# Patient Record
Sex: Female | Born: 1995 | Race: Black or African American | Hispanic: No | Marital: Single | Smoking: Current every day smoker
Health system: Southern US, Community
[De-identification: ages and names within clinical notes are randomized; demographics above are authoritative.]

## PROBLEM LIST (undated history)

## (undated) DIAGNOSIS — K5900 Constipation, unspecified: Secondary | ICD-10-CM

## (undated) DIAGNOSIS — J45909 Unspecified asthma, uncomplicated: Secondary | ICD-10-CM

## (undated) HISTORY — DX: Constipation, unspecified: K59.00

---
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
## 2017-06-08 ENCOUNTER — Ambulatory Visit (HOSPITAL_COMMUNITY)
Admission: EM | Admit: 2017-06-08 | Discharge: 2017-06-08 | Disposition: A | Discharge status: Home / Self Care | Payer: 59 | Attending: Family Medicine | Admitting: Family Medicine

## 2017-06-08 ENCOUNTER — Other Ambulatory Visit: Payer: Self-pay

## 2017-06-08 ENCOUNTER — Encounter (HOSPITAL_COMMUNITY): Payer: Self-pay | Admitting: Emergency Medicine

## 2017-06-08 DIAGNOSIS — S39012A Strain of muscle, fascia and tendon of lower back, initial encounter: Secondary | ICD-10-CM

## 2017-06-08 DIAGNOSIS — S161XXA Strain of muscle, fascia and tendon at neck level, initial encounter: Secondary | ICD-10-CM | POA: Diagnosis not present

## 2017-06-08 MED ORDER — IBUPROFEN 600 MG PO TABS: 600.0000 mg | ORAL_TABLET | Freq: Four times a day (QID) | ORAL | 0 refills | Status: AC | PRN
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
## 2018-09-27 ENCOUNTER — Emergency Department (HOSPITAL_COMMUNITY): Payer: 59

## 2018-09-27 ENCOUNTER — Emergency Department (HOSPITAL_COMMUNITY)
Admission: EM | Admit: 2018-09-27 | Discharge: 2018-09-28 | Disposition: A | Discharge status: Home / Self Care | Payer: 59 | Attending: Emergency Medicine | Admitting: Emergency Medicine

## 2018-09-27 ENCOUNTER — Other Ambulatory Visit: Payer: Self-pay

## 2018-09-27 DIAGNOSIS — M25512 Pain in left shoulder: Secondary | ICD-10-CM | POA: Diagnosis not present

## 2018-09-27 DIAGNOSIS — F17290 Nicotine dependence, other tobacco product, uncomplicated: Secondary | ICD-10-CM | POA: Diagnosis not present

## 2018-09-27 DIAGNOSIS — R51 Headache: Secondary | ICD-10-CM | POA: Insufficient documentation

## 2018-09-27 MED ORDER — METHOCARBAMOL 500 MG PO TABS
500.0000 mg | ORAL_TABLET | Freq: Two times a day (BID) | ORAL | 0 refills | Status: AC
Start: 2018-09-27 — End: 2018-10-04

## 2018-09-27 MED ORDER — DIAZEPAM 5 MG PO TABS
5.0000 mg | ORAL_TABLET | Freq: Once | ORAL | Status: AC
  Administered 2018-09-27: 5 mg via ORAL
  Filled 2018-09-27: qty 1

## 2018-09-27 MED ORDER — NAPROXEN 250 MG PO TABS
500.0000 mg | ORAL_TABLET | Freq: Once | ORAL | Status: AC
  Administered 2018-09-27: 500 mg via ORAL
  Filled 2018-09-27: qty 2

## 2018-09-27 MED ORDER — NAPROXEN 500 MG PO TABS: 500.0000 mg | ORAL_TABLET | Freq: Two times a day (BID) | ORAL | 0 refills | Status: AC
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
## 2018-09-27 NOTE — Discharge Instructions (Addendum)
I have prescribed muscle relaxers for your pain, please do not drink or drive while taking this medications as they can make you drowsy. You may apply ice or heat to the area, please follow up with PCP as needed.

## 2018-09-27 NOTE — ED Notes (Signed)
Patient transported to CT 
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
## 2020-05-06 ENCOUNTER — Ambulatory Visit: Payer: 59 | Attending: Internal Medicine

## 2020-05-06 ENCOUNTER — Other Ambulatory Visit: Payer: Self-pay

## 2020-05-06 DIAGNOSIS — Z23 Encounter for immunization: Secondary | ICD-10-CM

## 2020-05-06 NOTE — Progress Notes (Signed)
   Covid-19 Vaccination Clinic  Name:  Nicole Gonzales    MRN: [id number] DOB: 1995-07-25  05/06/2020  [name] was observed post Covid-19 immunization for 15 minutes without incident. She was provided with Vaccine Information Sheet and instruction to access the V-Safe system.   [name] was instructed to call 911 with any severe reactions post vaccine: Marland Kitchen Difficulty breathing  . Swelling of face and throat  . A fast heartbeat  . A bad rash all over body  . Dizziness and weakness   Immunizations Administered    Name Date Dose VIS Date Route   PFIZER Comrnaty(Gray TOP) Covid-19 Vaccine 05/06/2020  3:22 PM 0.3 mL 03/06/2020 Intramuscular   Manufacturer: [employer]   Lot: [id number]   NDC: [phone]
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
## 2021-07-12 ENCOUNTER — Emergency Department (HOSPITAL_BASED_OUTPATIENT_CLINIC_OR_DEPARTMENT_OTHER): Payer: 59

## 2021-07-12 ENCOUNTER — Emergency Department (HOSPITAL_BASED_OUTPATIENT_CLINIC_OR_DEPARTMENT_OTHER)
Admission: EM | Admit: 2021-07-12 | Discharge: 2021-07-12 | Disposition: A | Discharge status: Home / Self Care | Payer: 59 | Attending: Emergency Medicine | Admitting: Emergency Medicine

## 2021-07-12 ENCOUNTER — Encounter (HOSPITAL_BASED_OUTPATIENT_CLINIC_OR_DEPARTMENT_OTHER): Payer: Self-pay | Admitting: Emergency Medicine

## 2021-07-12 ENCOUNTER — Other Ambulatory Visit: Payer: Self-pay

## 2021-07-12 DIAGNOSIS — Z20822 Contact with and (suspected) exposure to covid-19: Secondary | ICD-10-CM | POA: Diagnosis not present

## 2021-07-12 DIAGNOSIS — R5383 Other fatigue: Secondary | ICD-10-CM | POA: Diagnosis not present

## 2021-07-12 DIAGNOSIS — R1084 Generalized abdominal pain: Secondary | ICD-10-CM | POA: Diagnosis not present

## 2021-07-12 DIAGNOSIS — R509 Fever, unspecified: Secondary | ICD-10-CM | POA: Diagnosis not present

## 2021-07-12 DIAGNOSIS — R519 Headache, unspecified: Secondary | ICD-10-CM | POA: Diagnosis not present

## 2021-07-12 DIAGNOSIS — R112 Nausea with vomiting, unspecified: Secondary | ICD-10-CM | POA: Diagnosis not present

## 2021-07-12 DIAGNOSIS — R5381 Other malaise: Secondary | ICD-10-CM | POA: Diagnosis not present

## 2021-07-12 DIAGNOSIS — R000 Tachycardia, unspecified: Secondary | ICD-10-CM | POA: Insufficient documentation

## 2021-07-12 HISTORY — DX: Unspecified asthma, uncomplicated: J45.909

## 2021-07-12 LAB — COMPREHENSIVE METABOLIC PANEL
ALT: 12 U/L (ref 0–44)
AST: 17 U/L (ref 15–41)
Albumin: 4.4 g/dL (ref 3.5–5.0)
Alkaline Phosphatase: 60 U/L (ref 38–126)
Anion gap: 9 (ref 5–15)
BUN: 11 mg/dL (ref 6–20)
CO2: 24 mmol/L (ref 22–32)
Calcium: 9.3 mg/dL (ref 8.9–10.3)
Chloride: 104 mmol/L (ref 98–111)
Creatinine, Ser: 0.83 mg/dL (ref 0.44–1.00)
GFR, Estimated: >60 mL/min (ref >60)
Glucose, Bld: 105 mg/dL — ABNORMAL HIGH (ref 70–99)
Potassium: 3.9 mmol/L (ref 3.5–5.1)
Sodium: 137 mmol/L (ref 135–145)
Total Bilirubin: 0.8 mg/dL (ref 0.3–1.2)
Total Protein: 7.8 g/dL (ref 6.5–8.1)

## 2021-07-12 LAB — CBC WITH DIFFERENTIAL/PLATELET
Abs Immature Granulocytes: 0.08 10*3/uL — ABNORMAL HIGH (ref 0.00–0.07)
Basophils Absolute: 0 10*3/uL (ref 0.0–0.1)
Basophils Relative: 0 %
Eosinophils Absolute: 0 10*3/uL (ref 0.0–0.5)
Eosinophils Relative: 0 %
HCT: 38.6 % (ref 36.0–46.0)
Hemoglobin: 12.8 g/dL (ref 12.0–15.0)
Immature Granulocytes: 1 %
Lymphocytes Relative: 9 %
Lymphs Abs: 1.5 10*3/uL (ref 0.7–4.0)
MCH: 30.3 pg (ref 26.0–34.0)
MCHC: 33.2 g/dL (ref 30.0–36.0)
MCV: 91.5 fL (ref 80.0–100.0)
Monocytes Absolute: 0.7 10*3/uL (ref 0.1–1.0)
Monocytes Relative: 4 %
Neutro Abs: 14.9 10*3/uL — ABNORMAL HIGH (ref 1.7–7.7)
Neutrophils Relative %: 86 %
Platelets: 397 10*3/uL (ref 150–400)
RBC: 4.22 MIL/uL (ref 3.87–5.11)
RDW: 13.9 % (ref 11.5–15.5)
WBC: 17.3 10*3/uL — ABNORMAL HIGH (ref 4.0–10.5)
nRBC: 0 % (ref 0.0–0.2)

## 2021-07-12 LAB — URINALYSIS, ROUTINE W REFLEX MICROSCOPIC
Bilirubin Urine: NEGATIVE
Glucose, UA: NEGATIVE mg/dL
Hgb urine dipstick: NEGATIVE
Ketones, ur: NEGATIVE mg/dL
Leukocytes,Ua: NEGATIVE
Nitrite: NEGATIVE
Specific Gravity, Urine: 1.028 (ref 1.005–1.030)
pH: 8 (ref 5.0–8.0)

## 2021-07-12 LAB — RESP PANEL BY RT-PCR (FLU A&B, COVID) ARPGX2
Influenza A by PCR: NEGATIVE
Influenza B by PCR: NEGATIVE
SARS Coronavirus 2 by RT PCR: NEGATIVE

## 2021-07-12 LAB — PREGNANCY, URINE: Preg Test, Ur: NEGATIVE

## 2021-07-12 LAB — LACTIC ACID, PLASMA: Lactic Acid, Venous: 1.4 mmol/L (ref 0.5–1.9)

## 2021-07-12 LAB — LIPASE, BLOOD: Lipase: 11 U/L (ref 11–51)

## 2021-07-12 MED ORDER — ACETAMINOPHEN 325 MG PO TABS
650.0000 mg | ORAL_TABLET | Freq: Once | ORAL | Status: AC
  Administered 2021-07-12: 650 mg via ORAL
  Filled 2021-07-12: qty 2

## 2021-07-12 MED ORDER — PROCHLORPERAZINE EDISYLATE 10 MG/2ML IJ SOLN
10.0000 mg | Freq: Once | INTRAMUSCULAR | Status: AC
  Administered 2021-07-12: 10 mg via INTRAVENOUS
  Filled 2021-07-12: qty 2

## 2021-07-12 MED ORDER — SODIUM CHLORIDE 0.9 % IV BOLUS
1000.0000 mL | Freq: Once | INTRAVENOUS | Status: AC
  Administered 2021-07-12: 1000 mL via INTRAVENOUS

## 2021-07-12 MED ORDER — DIPHENHYDRAMINE HCL 50 MG/ML IJ SOLN
50.0000 mg | Freq: Once | INTRAMUSCULAR | Status: AC
  Administered 2021-07-12: 50 mg via INTRAVENOUS
  Filled 2021-07-12: qty 1

## 2021-07-12 MED ORDER — PROCHLORPERAZINE MALEATE 10 MG PO TABS: 10.0000 mg | ORAL_TABLET | Freq: Two times a day (BID) | ORAL | 0 refills | Status: AC | PRN

## 2021-07-12 NOTE — ED Notes (Signed)
IV removed, per Joellen Jersey - RN ?

## 2021-07-12 NOTE — ED Triage Notes (Signed)
Pt c/o fever 99 onset last night, pt took tylenol but vomited afterwards. Pt c/o body aches and headache. Pt denies exposure to others with illness. ?

## 2021-07-12 NOTE — Discharge Instructions (Signed)
Your history, exam, work-up today are suggestive of a diffuse viral infection causing your multitude of symptoms.  Your headache significant improved after the headache cocktail and your work-up did not show evidence of acute bacterial infection in the lungs, urine, or in your abdomen.  We had a long shared decision-making conversation including offering lumbar puncture to definitively rule meningitis however given your improving symptoms and well appearance we thought it was reasonable to discharge to have conservative outpatient management however if symptoms were to change or worsen acutely, you need to return to the emergency department and I anticipate they would consider lumbar puncture at that time.  Please rest and stay hydrated and follow-up with your primary doctor. ?

## 2021-07-12 NOTE — ED Provider Notes (Signed)
?[facility] ?Provider Note ? ? ?CSN: [id number] ?Arrival date & time: 07/12/21  3729 ? ?  ? ?History ? ?Chief Complaint  ?Patient presents with  ? Fever  ? ? ?Nicole Gonzales is a 26 y.o. female. ? ?The history is provided by the patient, a relative and medical records. No language interpreter was used.  ?Fever ?Max temp prior to arrival:  102.9 ?Temp source:  Oral ?Severity:  Moderate ?Onset quality:  Gradual ?Duration:  2 days ?Timing:  Constant ?Progression:  Waxing and waning ?Chronicity:  New ?Relieved by:  Nothing ?Worsened by:  Nothing ?Ineffective treatments:  None tried ?Associated symptoms: chills, cough, headaches, myalgias, nausea and vomiting   ?Associated symptoms: no chest pain, no confusion, no congestion, no diarrhea, no dysuria, no rash and no sore throat   ?Risk factors: sick contacts   ? ?  ? ?Home Medications ?Prior to Admission medications   ?Medication Sig Start Date End Date Taking? Authorizing Provider  ?ibuprofen (ADVIL,MOTRIN) 600 MG tablet Take 1 tablet (600 mg total) by mouth every 6 (six) hours as needed. 06/08/17   Vanessa Kick, MD  ?   ? ?Allergies    ?Patient has no known allergies.   ? ?Review of Systems   ?Review of Systems  ?Constitutional:  Positive for chills, fatigue and fever. Negative for diaphoresis.  ?HENT:  Negative for congestion and sore throat.   ?Eyes:  Positive for photophobia. Negative for visual disturbance.  ?Respiratory:  Positive for cough. Negative for chest tightness, shortness of breath and wheezing.   ?Cardiovascular:  Negative for chest pain, palpitations and leg swelling.  ?Gastrointestinal:  Positive for abdominal pain (mild ache chronically reported but unchanged today), nausea and vomiting. Negative for constipation and diarrhea.  ?Genitourinary:  Negative for dysuria, flank pain and frequency.  ?Musculoskeletal:  Positive for myalgias. Negative for back pain and neck stiffness.  ?Skin:  Negative for rash and wound.   ?Neurological:  Positive for headaches. Negative for dizziness, seizures, weakness, light-headedness and numbness.  ?Psychiatric/Behavioral:  Negative for agitation and confusion.   ?All other systems reviewed and are negative. ? ?Physical Exam ?Updated Vital Signs ?BP 115/65 (BP Location: Left Arm)   Pulse (!) 108   Temp (!) 101.7 ?F (38.7 ?C) (Oral)   Resp 20   Ht '5\' 6"'$  (1.676 m)   Wt 113.4 kg   LMP 07/05/2021   SpO2 94%   BMI 40.35 kg/m?  ?Physical Exam ?Vitals and nursing note reviewed.  ?Constitutional:   ?   General: She is not in acute distress. ?   Appearance: She is well-developed. She is not ill-appearing, toxic-appearing or diaphoretic.  ?HENT:  ?   Head: Normocephalic and atraumatic.  ?   Nose: No congestion or rhinorrhea.  ?   Mouth/Throat:  ?   Mouth: Mucous membranes are dry.  ?   Pharynx: No oropharyngeal exudate or posterior oropharyngeal erythema.  ?Eyes:  ?   Extraocular Movements: Extraocular movements intact.  ?   Conjunctiva/sclera: Conjunctivae normal.  ?   Pupils: Pupils are equal, round, and reactive to light.  ?Neck:  ?   Vascular: No carotid bruit.  ?Cardiovascular:  ?   Rate and Rhythm: Regular rhythm. Tachycardia present.  ?   Heart sounds: No murmur heard. ?Pulmonary:  ?   Effort: Pulmonary effort is normal. No respiratory distress.  ?   Breath sounds: Normal breath sounds. No wheezing, rhonchi or rales.  ?Chest:  ?   Chest wall: No tenderness.  ?  Abdominal:  ?   General: Abdomen is flat.  ?   Palpations: Abdomen is soft.  ?   Tenderness: There is abdominal tenderness (diffuse mild, and per pt at baseomine). There is no right CVA tenderness, left CVA tenderness, guarding or rebound.  ?Musculoskeletal:     ?   General: No swelling or tenderness.  ?   Cervical back: Normal range of motion and neck supple. No rigidity or tenderness.  ?   Right lower leg: No edema.  ?   Left lower leg: No edema.  ?Skin: ?   General: Skin is warm and dry.  ?   Capillary Refill: Capillary refill takes  less than 2 seconds.  ?   Findings: No erythema or rash.  ?Neurological:  ?   General: No focal deficit present.  ?   Mental Status: She is alert and oriented to person, place, and time. Mental status is at baseline.  ?   Motor: No weakness.  ?Psychiatric:     ?   Mood and Affect: Mood normal.  ? ? ?ED Results / Procedures / Treatments   ?Labs ?(all labs ordered are listed, but only abnormal results are displayed) ?Labs Reviewed  ?CBC WITH DIFFERENTIAL/PLATELET - Abnormal; Notable for the following components:  ?    Result Value  ? WBC 17.3 (*)   ? Neutro Abs 14.9 (*)   ? Abs Immature Granulocytes 0.08 (*)   ? All other components within normal limits  ?COMPREHENSIVE METABOLIC PANEL - Abnormal; Notable for the following components:  ? Glucose, Bld 105 (*)   ? All other components within normal limits  ?URINALYSIS, ROUTINE W REFLEX MICROSCOPIC - Abnormal; Notable for the following components:  ? Protein, ur TRACE (*)   ? All other components within normal limits  ?RESP PANEL BY RT-PCR (FLU A&B, COVID) [id number]  ?URINE CULTURE  ?CULTURE, BLOOD (ROUTINE X 2)  ?CULTURE, BLOOD (ROUTINE X 2)  ?LACTIC ACID, PLASMA  ?LIPASE, BLOOD  ?PREGNANCY, URINE  ?LACTIC ACID, PLASMA  ? ? ?EKG ?None ? ?Radiology ?DG Chest Portable 1 View ? ?Result Date: 07/12/2021 ?CLINICAL DATA:  Fever, cough.  Malaise. EXAM: PORTABLE CHEST 1 VIEW COMPARISON:  None. FINDINGS: Normal heart, mediastinum and hila. Clear lungs.  No pleural effusion or pneumothorax. Skeletal structures are unremarkable. IMPRESSION: No active disease. Electronically Signed   By: Lajean Manes M.D.   On: 07/12/2021 10:48   ? ?Procedures ?Procedures  ? ? ?Medications Ordered in ED ?Medications  ?acetaminophen (TYLENOL) tablet 650 mg (650 mg Oral Given 07/12/21 1058)  ?sodium chloride 0.9 % bolus 1,000 mL (0 mLs Intravenous Stopped 07/12/21 1159)  ?prochlorperazine (COMPAZINE) injection 10 mg (10 mg Intravenous Given 07/12/21 1055)  ?diphenhydrAMINE (BENADRYL) injection 50 mg (50 mg  Intravenous Given 07/12/21 1055)  ? ? ?ED Course/ Medical Decision Making/ A&P ?  ?                        ?Medical Decision Making ?Amount and/or Complexity of Data Reviewed ?Labs: ordered. ?Radiology: ordered. ? ?Risk ?OTC drugs. ?Prescription drug management. ? ? ? ?OLETHA TOLSON is a 26 y.o. female with a past medical history significant for chronic abdominal pain with constipation and asthma who presents with fevers, chills, cough, nausea, vomiting, malaise, fatigue, diffuse soreness, and headache.  According to patient, since yesterday she has been feeling ill.  She has had some family friends who have had some viral illnesses but has not had any confirmed  cases of COVID or flu recently around.  Patient says that she initially had temperature of 99 and try to some Tylenol but then started having nausea and vomiting and then today the fever has worsened.  She reports she is having chills and has had some cough.  It is not productive.  She reports soreness and aching all over but it is not focally in her neck or back.  She does report some moderate headache but also has photophobia.  She denies any chest pain or palpitations or shortness of breath.  She reports her abdomen always hurts and that is not different than baseline.  She denies any urinary changes.  She denies rashes.  She reports she just feels ill and tired and fatigued all over. ? ?On exam, lungs are clear.  Chest was nontender.  Abdomen was mildly tender but with no focality.  Back was tender paraspinally all over but no focal tenderness in the neck.  Patient had normal range of motion of the neck.  No focal neurologic deficits initially.  Pupils symmetric and reactive with normal extraocular movements.  Patient has photophobia.  Clear speech.  No rashes seen.  Dry mucous membranes. ? ?Had a shared decision made conversation with patient and family.  Given her diffuse symptoms not being isolated to her head and neck we have low suspicion for  meningitis at this time and agreed to hold on lumbar puncture however, we do agree to get chest x-ray agree with the cough, labs with the vomiting and fatigue, and get urine.  We will also give a headache cocktail with hydration

## 2021-07-13 LAB — URINE CULTURE: Culture: NO GROWTH

## 2021-07-17 LAB — CULTURE, BLOOD (ROUTINE X 2)
Culture: NO GROWTH
Special Requests: ADEQUATE

## 2022-11-28 ENCOUNTER — Emergency Department (HOSPITAL_BASED_OUTPATIENT_CLINIC_OR_DEPARTMENT_OTHER): Payer: 59

## 2022-11-28 ENCOUNTER — Emergency Department (HOSPITAL_BASED_OUTPATIENT_CLINIC_OR_DEPARTMENT_OTHER)
Admission: EM | Admit: 2022-11-28 | Discharge: 2022-11-28 | Disposition: A | Discharge status: Home / Self Care | Payer: 59 | Attending: Emergency Medicine | Admitting: Emergency Medicine

## 2022-11-28 ENCOUNTER — Other Ambulatory Visit: Payer: Self-pay

## 2022-11-28 ENCOUNTER — Encounter (HOSPITAL_BASED_OUTPATIENT_CLINIC_OR_DEPARTMENT_OTHER): Payer: Self-pay

## 2022-11-28 DIAGNOSIS — R109 Unspecified abdominal pain: Secondary | ICD-10-CM | POA: Diagnosis not present

## 2022-11-28 DIAGNOSIS — R1033 Periumbilical pain: Secondary | ICD-10-CM | POA: Diagnosis not present

## 2022-11-28 DIAGNOSIS — K529 Noninfective gastroenteritis and colitis, unspecified: Secondary | ICD-10-CM | POA: Diagnosis not present

## 2022-11-28 LAB — CBC
HCT: 42 % (ref 36.0–46.0)
Hemoglobin: 14.2 g/dL (ref 12.0–15.0)
MCH: 31.2 pg (ref 26.0–34.0)
MCHC: 33.8 g/dL (ref 30.0–36.0)
MCV: 92.3 fL (ref 80.0–100.0)
Platelets: 453 10*3/uL — ABNORMAL HIGH (ref 150–400)
RBC: 4.55 MIL/uL (ref 3.87–5.11)
RDW: 14 % (ref 11.5–15.5)
WBC: 8.7 10*3/uL (ref 4.0–10.5)
nRBC: 0 % (ref 0.0–0.2)

## 2022-11-28 LAB — URINALYSIS, ROUTINE W REFLEX MICROSCOPIC
Bilirubin Urine: NEGATIVE
Glucose, UA: NEGATIVE mg/dL
Ketones, ur: NEGATIVE mg/dL
Leukocytes,Ua: NEGATIVE
Nitrite: NEGATIVE
Specific Gravity, Urine: 1.018 (ref 1.005–1.030)
pH: 5.5 (ref 5.0–8.0)

## 2022-11-28 LAB — COMPREHENSIVE METABOLIC PANEL
ALT: 13 U/L (ref 0–44)
AST: 21 U/L (ref 15–41)
Albumin: 4.6 g/dL (ref 3.5–5.0)
Alkaline Phosphatase: 51 U/L (ref 38–126)
Anion gap: 15 (ref 5–15)
BUN: 13 mg/dL (ref 6–20)
CO2: 19 mmol/L — ABNORMAL LOW (ref 22–32)
Calcium: 9.3 mg/dL (ref 8.9–10.3)
Chloride: 103 mmol/L (ref 98–111)
Creatinine, Ser: 0.87 mg/dL (ref 0.44–1.00)
GFR, Estimated: >60 mL/min (ref >60)
Glucose, Bld: 109 mg/dL — ABNORMAL HIGH (ref 70–99)
Potassium: 3.8 mmol/L (ref 3.5–5.1)
Sodium: 137 mmol/L (ref 135–145)
Total Bilirubin: 0.6 mg/dL (ref 0.3–1.2)
Total Protein: 8.1 g/dL (ref 6.5–8.1)

## 2022-11-28 LAB — PREGNANCY, URINE: Preg Test, Ur: NEGATIVE

## 2022-11-28 LAB — LIPASE, BLOOD: Lipase: 14 U/L (ref 11–51)

## 2022-11-28 MED ORDER — OXYCODONE HCL 5 MG PO TABS
5.0000 mg | ORAL_TABLET | Freq: Once | ORAL | Status: AC
  Administered 2022-11-28: 5 mg via ORAL
  Filled 2022-11-28: qty 1

## 2022-11-28 MED ORDER — FLORANEX PO PACK: 1.0000 g | PACK | Freq: Three times a day (TID) | ORAL | 1 refills | Status: DC

## 2022-11-28 MED ORDER — FLORANEX PO PACK
1.0000 g | PACK | Freq: Three times a day (TID) | ORAL | 1 refills | Status: AC
Start: 2022-11-28 — End: ?

## 2022-11-28 MED ORDER — OXYCODONE HCL 5 MG PO TABS
5.0000 mg | ORAL_TABLET | Freq: Four times a day (QID) | ORAL | 0 refills | Status: AC | PRN
Start: 2022-11-28 — End: ?

## 2022-11-28 MED ORDER — IOHEXOL 300 MG/ML  SOLN
100.0000 mL | Freq: Once | INTRAMUSCULAR | Status: AC | PRN
  Administered 2022-11-28: 100 mL via INTRAVENOUS

## 2022-11-28 MED ORDER — OXYCODONE HCL 5 MG PO TABS
5.0000 mg | ORAL_TABLET | Freq: Four times a day (QID) | ORAL | 0 refills | Status: DC | PRN
Start: 2022-11-28 — End: 2022-11-28

## 2022-11-28 MED ORDER — AMOXICILLIN-POT CLAVULANATE 875-125 MG PO TABS
1.0000 | ORAL_TABLET | Freq: Two times a day (BID) | ORAL | 0 refills | Status: AC
Start: 2022-11-28 — End: ?

## 2022-11-28 MED ORDER — AMOXICILLIN-POT CLAVULANATE 875-125 MG PO TABS
1.0000 | ORAL_TABLET | Freq: Two times a day (BID) | ORAL | 0 refills | Status: DC
Start: 2022-11-28 — End: 2022-11-28

## 2022-11-28 MED ORDER — AMOXICILLIN-POT CLAVULANATE 875-125 MG PO TABS
1.0000 | ORAL_TABLET | Freq: Once | ORAL | Status: AC
  Administered 2022-11-28: 1 via ORAL
  Filled 2022-11-28: qty 1

## 2022-11-28 NOTE — ED Provider Notes (Signed)
[facility] EMERGENCY DEPARTMENT AT [facility] Provider Note   CSN: [id number] Arrival date & time: 11/28/22  1404     History  Chief Complaint  Patient presents with   Abdominal Pain    Nicole Gonzales is a 27 y.o. female.  Patient with no past surgical history presents to the emergency department today for evaluation of left-sided abdominal pain that started this morning.  Pain was more mild at onset but then ramped up this afternoon and she had a couple of episodes of vomiting associated with the severe pain.  She denies chest pain or shortness of breath.  No fevers.  She denies irritative UTI symptoms including dysuria, increased frequency or urgency as well as hematuria.  No vaginal bleeding or discharge.  She does not really have pain in the back.  No diarrhea or blood in the stool.       Home Medications Prior to Admission medications   Medication Sig Start Date End Date Taking? Authorizing Provider  ibuprofen (ADVIL,MOTRIN) 600 MG tablet Take 1 tablet (600 mg total) by mouth every 6 (six) hours as needed. 06/08/17   Mardella Layman, MD  prochlorperazine (COMPAZINE) 10 MG tablet Take 1 tablet (10 mg total) by mouth 2 (two) times daily as needed for nausea or vomiting. 07/12/21   Tegeler, Canary Brim, MD      Allergies    Patient has no known allergies.    Review of Systems   Review of Systems  Physical Exam Updated Vital Signs BP 108/75 (BP Location: Right Arm)   Pulse 99   Temp 97.6 F (36.4 C) (Oral)   Resp 19   Ht 5\' 7"  (1.702 m)   Wt 127 kg   LMP 11/18/2022   SpO2 99%   BMI 43.85 kg/m   Physical Exam Vitals and nursing note reviewed.  Constitutional:      General: She is not in acute distress.    Appearance: She is well-developed.  HENT:     Head: Normocephalic and atraumatic.     Right Ear: External ear normal.     Left Ear: External ear normal.     Nose: Nose normal.  Eyes:     Conjunctiva/sclera: Conjunctivae normal.  Cardiovascular:      Rate and Rhythm: Normal rate and regular rhythm.     Heart sounds: No murmur heard. Pulmonary:     Effort: No respiratory distress.     Breath sounds: No wheezing, rhonchi or rales.  Abdominal:     Palpations: Abdomen is soft.     Tenderness: There is abdominal tenderness in the periumbilical area, suprapubic area and left lower quadrant. There is no guarding or rebound.  Musculoskeletal:     Cervical back: Normal range of motion and neck supple.     Right lower leg: No edema.     Left lower leg: No edema.  Skin:    General: Skin is warm and dry.     Findings: No rash.  Neurological:     General: No focal deficit present.     Mental Status: She is alert. Mental status is at baseline.     Motor: No weakness.  Psychiatric:        Mood and Affect: Mood normal.     ED Results / Procedures / Treatments   Labs (all labs ordered are listed, but only abnormal results are displayed) Labs Reviewed  COMPREHENSIVE METABOLIC PANEL - Abnormal; Notable for the following components:      Result  Value   CO2 19 (*)    Glucose, Bld 109 (*)    All other components within normal limits  CBC - Abnormal; Notable for the following components:   Platelets 453 (*)    All other components within normal limits  URINALYSIS, ROUTINE W REFLEX MICROSCOPIC - Abnormal; Notable for the following components:   Hgb urine dipstick LARGE (*)    Protein, ur TRACE (*)    Bacteria, UA RARE (*)    All other components within normal limits  LIPASE, BLOOD  PREGNANCY, URINE    EKG None  Radiology CT ABDOMEN PELVIS W CONTRAST  Result Date: 11/28/2022 CLINICAL DATA:  Acute abdominal pain. EXAM: CT ABDOMEN AND PELVIS WITH CONTRAST TECHNIQUE: Multidetector CT imaging of the abdomen and pelvis was performed using the standard protocol following bolus administration of intravenous contrast. RADIATION DOSE REDUCTION: This exam was performed according to the departmental dose-optimization program which includes  automated exposure control, adjustment of the mA and/or kV according to patient size and/or use of iterative reconstruction technique. CONTRAST:  OMNIPAQUE IOHEXOL 300 MG/ML  SOLN COMPARISON:  Abdominal radiograph dated 01/18/2006. FINDINGS: Lower chest: No acute abnormality. Hepatobiliary: No focal liver abnormality is seen. No gallstones, gallbladder wall thickening, or biliary dilatation. Pancreas: Unremarkable. No pancreatic ductal dilatation or surrounding inflammatory changes. Spleen: Normal in size without focal abnormality. Adrenals/Urinary Tract: Adrenal glands are unremarkable. Kidneys are normal, without renal calculi, focal lesion, or hydronephrosis. Bladder is unremarkable. Stomach/Bowel: Stomach is within normal limits. There is mild bowel wall thickening from the ascending to the descending colon. Appendix appears normal. No bowel obstruction. Vascular/Lymphatic: No significant vascular findings are present. No enlarged abdominal or pelvic lymph nodes. Reproductive: Uterus and bilateral adnexa are unremarkable. Other: No abdominal wall hernia or abnormality. No abdominopelvic ascites. Musculoskeletal: No acute or significant osseous findings. IMPRESSION: Mild bowel wall thickening from the ascending to the descending colon is suggestive of infectious or inflammatory colitis. Electronically Signed   By: Romona Curls M.D.   On: 11/28/2022 17:23    Procedures Procedures    Medications Ordered in ED Medications  iohexol (OMNIPAQUE) 300 MG/ML solution 100 mL (100 mLs Intravenous Contrast Given 11/28/22 1624)  oxyCODONE (Oxy IR/ROXICODONE) immediate release tablet 5 mg (5 mg Oral Given 11/28/22 1803)  amoxicillin-clavulanate (AUGMENTIN) 875-125 MG per tablet 1 tablet (1 tablet Oral Given 11/28/22 1804)    ED Course/ Medical Decision Making/ A&P    Patient seen and examined. History obtained directly from patient. Work-up including labs, imaging, EKG ordered in triage, if performed, were  reviewed.    Labs/EKG: Independently reviewed and interpreted.  This included: CBC unremarkable; CMP unremarkable; lipase normal; UA with blood, otherwise no compelling signs of infection, pregnancy negative.  Imaging: CT abdomen and pelvis with contrast ordered in triage, pending.  Medications/Fluids: Ordered: Toradol and Zofran  Most recent vital signs reviewed and are as follows: BP 108/75 (BP Location: Right Arm)   Pulse 99   Temp 97.6 F (36.4 C) (Oral)   Resp 19   Ht 5\' 7"  (1.702 m)   Wt 127 kg   LMP 11/18/2022   SpO2 99%   BMI 43.85 kg/m   Initial impression: Left-sided abdominal pain, hematuria microscopic, question ureteral colic.    Reassessment performed. Patient appears comfortable.  Imaging personally visualized and interpreted including: CT suggestive of colitis, otherwise unremarkable.  Reviewed pertinent lab work and imaging with patient at bedside. Questions answered.   Most current vital signs reviewed and are as follows: BP  117/73   Pulse 65   Temp 98.2 F (36.8 C) (Oral)   Resp 18   Ht 5\' 7"  (1.702 m)   Wt 127 kg   LMP 11/18/2022   SpO2 100%   BMI 43.85 kg/m   Plan: Discharge to home.   Prescriptions written for: Augmentin, oxycodone, lactobacillus  Other home care instructions discussed: Bland diet, rest and hydration  ED return instructions discussed: The patient was urged to return to the Emergency Department immediately with worsening of current symptoms, worsening abdominal pain, persistent vomiting, blood noted in stools, fever, or any other concerns. The patient verbalized understanding.   Follow-up instructions discussed: Patient encouraged to follow-up with their PCP in 3 days.                                  Medical Decision Making Amount and/or Complexity of Data Reviewed Labs: ordered. Radiology: ordered.  Risk OTC drugs. Prescription drug management.   For this patient's complaint of abdominal pain, the following  conditions were considered on the differential diagnosis: gastritis/PUD, enteritis/duodenitis, appendicitis, cholelithiasis/cholecystitis, cholangitis, pancreatitis, ruptured viscus, colitis, diverticulitis, small/large bowel obstruction, proctitis, cystitis, pyelonephritis, ureteral colic, aortic dissection, aortic aneurysm. In women, ectopic pregnancy, pelvic inflammatory disease, ovarian cysts, and tubo-ovarian abscess were also considered. Atypical chest etiologies were also considered including ACS, PE, and pneumonia.  Lab workup is reassuring.  She does have blood in the urine which she was made aware of.  Initial concern was for renal colic however CT is concerning for colitis.  No complications.  Patient be started on Augmentin and discharged home with symptom control.  Follow-up plan as above.  The patient's vital signs, pertinent lab work and imaging were reviewed and interpreted as discussed in the ED course. Hospitalization was considered for further testing, treatments, or serial exams/observation. However as patient is well-appearing, has a stable exam, and reassuring studies today, I do not feel that they warrant admission at this time. This plan was discussed with the patient who verbalizes agreement and comfort with this plan and seems reliable and able to return to the Emergency Department with worsening or changing symptoms.           Final Clinical Impression(s) / ED Diagnoses Final diagnoses:  Colitis    Rx / DC Orders ED Discharge Orders          Ordered    amoxicillin-clavulanate (AUGMENTIN) 875-125 MG tablet  Every 12 hours,   Status:  Discontinued        11/28/22 1831    lactobacillus (FLORANEX/LACTINEX) PACK  3 times daily with meals,   Status:  Discontinued        11/28/22 1831    oxyCODONE (OXY IR/ROXICODONE) 5 MG immediate release tablet  Every 6 hours PRN,   Status:  Discontinued        11/28/22 1832    lactobacillus (FLORANEX/LACTINEX) PACK  3 times daily  with meals,   Status:  Discontinued        11/28/22 1843    amoxicillin-clavulanate (AUGMENTIN) 875-125 MG tablet  Every 12 hours        11/28/22 1858    lactobacillus (FLORANEX/LACTINEX) PACK  3 times daily with meals        11/28/22 1858    oxyCODONE (OXY IR/ROXICODONE) 5 MG immediate release tablet  Every 6 hours PRN        11/28/22 1858  Renne Crigler, PA-C 11/28/22 2053    Virgina Norfolk, DO 11/28/22 2330

## 2022-11-28 NOTE — Discharge Instructions (Signed)
Please read and follow all provided instructions.  Your diagnoses today include:  1. Colitis     Tests performed today include: Blood cell counts and platelets: Normal blood cell counts Kidney and liver function tests: No problem Pancreas function test (called lipase) Urine test to look for infection: No urine infection, does show some blood in the urine A blood or urine test for pregnancy (women only) CT scan of the abdomen and pelvis suggests colitis in the colon Vital signs. See below for your results today.   Medications prescribed:  Oxycodone - narcotic pain medication  DO NOT drive or perform any activities that require you to be awake and alert because this medicine can make you drowsy.   Augmentin - antibiotic  You have been prescribed an antibiotic medicine: take the entire course of medicine even if you are feeling better. Stopping early can cause the antibiotic not to work.  Take any prescribed medications only as directed.  Home care instructions:  Follow any educational materials contained in this packet.  Follow-up instructions: Please follow-up with your primary care provider in the next 3 days for further evaluation of your symptoms.    Return instructions:  SEEK IMMEDIATE MEDICAL ATTENTION IF: The pain does not go away or becomes severe  A temperature above 101F develops  Repeated vomiting occurs (multiple episodes)  The pain becomes localized to portions of the abdomen. The right side could possibly be appendicitis. In an adult, the left lower portion of the abdomen could be colitis or diverticulitis.  Blood is being passed in stools or vomit (bright red or black tarry stools)  You develop chest pain, difficulty breathing, dizziness or fainting, or become confused, poorly responsive, or inconsolable (young children) If you have any other emergent concerns regarding your health  Additional Information: Abdominal (belly) pain can be caused by many things.  Your caregiver performed an examination and possibly ordered blood/urine tests and imaging (CT scan, x-rays, ultrasound). Many cases can be observed and treated at home after initial evaluation in the emergency department. Even though you are being discharged home, abdominal pain can be unpredictable. Therefore, you need a repeated exam if your pain does not resolve, returns, or worsens. Most patients with abdominal pain don't have to be admitted to the hospital or have surgery, but serious problems like appendicitis and gallbladder attacks can start out as nonspecific pain. Many abdominal conditions cannot be diagnosed in one visit, so follow-up evaluations are very important.  Your vital signs today were: BP 110/62 (BP Location: Left Arm)   Pulse 62   Temp 98.2 F (36.8 C) (Oral)   Resp 14   Ht 5\' 7"  (1.702 m)   Wt 127 kg   LMP 11/18/2022   SpO2 100%   BMI 43.85 kg/m  If your blood pressure (bp) was elevated above 135/85 this visit, please have this repeated by your doctor within one month. --------------

## 2022-11-28 NOTE — ED Triage Notes (Addendum)
Pt to ED c/o Left side abdominal pain that started this morning. Reports n/v. Denies abdominal surgery hx

## 2023-12-14 IMAGING — DX DG CHEST 1V PORT
1 series · 1 of 1 positions shown · non-contrast
Comparison: None.

CLINICAL DATA: Fever, cough.  Malaise.

EXAM:
PORTABLE CHEST 1 VIEW

[chest ap]
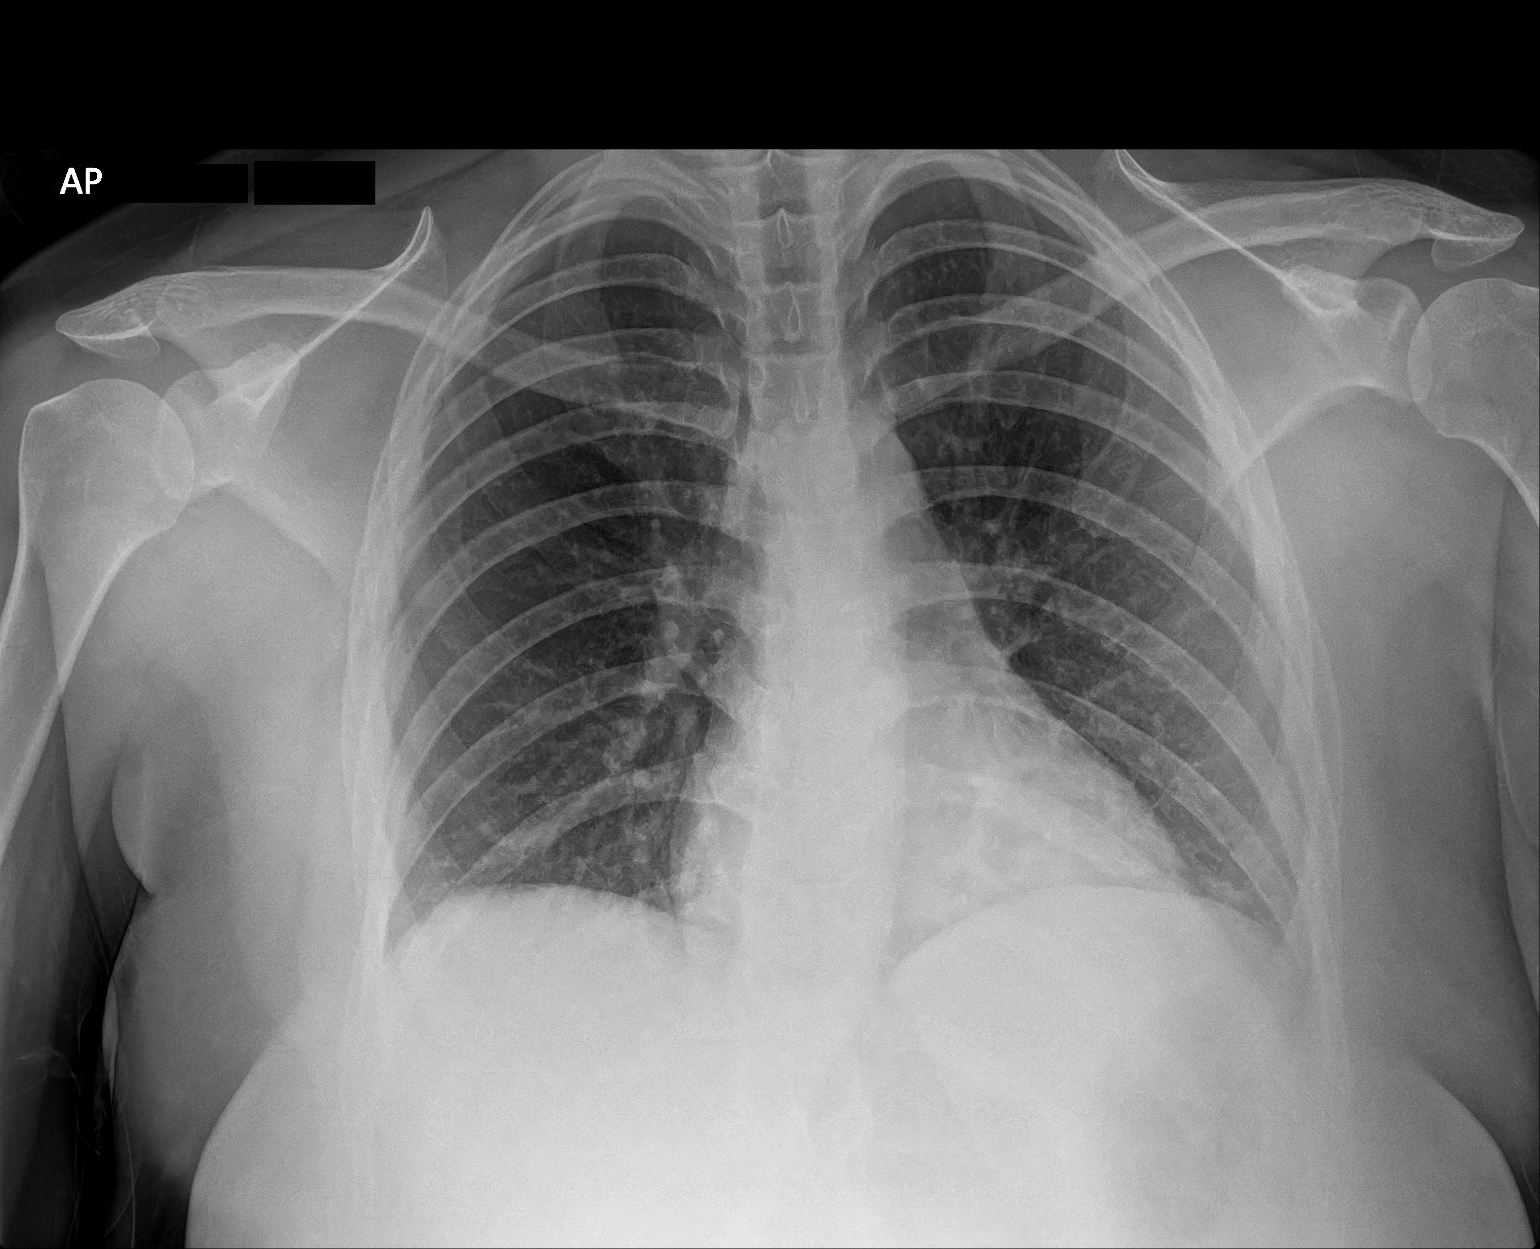

[1 of 1 positions shown; findings below may reference images not displayed]

FINDINGS: Normal heart, mediastinum and hila.

Clear lungs.  No pleural effusion or pneumothorax.

Skeletal structures are unremarkable.
IMPRESSION: No active disease.
